# Patient Record
Sex: Male | Born: 1995 | Race: Black or African American | Hispanic: No | Marital: Single | Smoking: Never smoker
Health system: Southern US, Community
[De-identification: ages and names within clinical notes are randomized; demographics above are authoritative.]

---

## 2020-01-12 ENCOUNTER — Other Ambulatory Visit: Payer: Self-pay

## 2020-08-13 ENCOUNTER — Emergency Department (HOSPITAL_COMMUNITY): Payer: Medicaid Other

## 2020-08-13 ENCOUNTER — Emergency Department (HOSPITAL_COMMUNITY)
Admission: EM | Admit: 2020-08-13 | Discharge: 2020-08-13 | Disposition: A | Discharge status: Home / Self Care | Payer: Medicaid Other | Attending: Emergency Medicine | Admitting: Emergency Medicine

## 2020-08-13 ENCOUNTER — Other Ambulatory Visit: Payer: Self-pay

## 2020-08-13 ENCOUNTER — Encounter (HOSPITAL_COMMUNITY): Payer: Self-pay | Admitting: Emergency Medicine

## 2020-08-13 DIAGNOSIS — Z20822 Contact with and (suspected) exposure to covid-19: Secondary | ICD-10-CM | POA: Insufficient documentation

## 2020-08-13 DIAGNOSIS — R079 Chest pain, unspecified: Secondary | ICD-10-CM | POA: Insufficient documentation

## 2020-08-13 DIAGNOSIS — J069 Acute upper respiratory infection, unspecified: Secondary | ICD-10-CM

## 2020-08-13 LAB — CBC WITH DIFFERENTIAL/PLATELET
Abs Immature Granulocytes: 0.03 10*3/uL (ref 0.00–0.07)
Basophils Absolute: 0.1 10*3/uL (ref 0.0–0.1)
Basophils Relative: 1 %
Eosinophils Absolute: 0.2 10*3/uL (ref 0.0–0.5)
Eosinophils Relative: 3 %
HCT: 46.5 % (ref 39.0–52.0)
Hemoglobin: 15.8 g/dL (ref 13.0–17.0)
Immature Granulocytes: 0 %
Lymphocytes Relative: 46 %
Lymphs Abs: 4.1 10*3/uL — ABNORMAL HIGH (ref 0.7–4.0)
MCH: 26.2 pg (ref 26.0–34.0)
MCHC: 34 g/dL (ref 30.0–36.0)
MCV: 77 fL — ABNORMAL LOW (ref 80.0–100.0)
Monocytes Absolute: 0.7 10*3/uL (ref 0.1–1.0)
Monocytes Relative: 8 %
Neutro Abs: 3.7 10*3/uL (ref 1.7–7.7)
Neutrophils Relative %: 42 %
Platelets: 378 10*3/uL (ref 150–400)
RBC: 6.04 MIL/uL — ABNORMAL HIGH (ref 4.22–5.81)
RDW: 15 % (ref 11.5–15.5)
WBC: 8.8 10*3/uL (ref 4.0–10.5)
nRBC: 0 % (ref 0.0–0.2)

## 2020-08-13 LAB — RESPIRATORY PANEL BY RT PCR (FLU A&B, COVID)
Influenza A by PCR: NEGATIVE
Influenza B by PCR: NEGATIVE
SARS Coronavirus 2 by RT PCR: NEGATIVE

## 2020-08-13 LAB — BASIC METABOLIC PANEL
Anion gap: 10 (ref 5–15)
BUN: 10 mg/dL (ref 6–20)
CO2: 26 mmol/L (ref 22–32)
Calcium: 9.4 mg/dL (ref 8.9–10.3)
Chloride: 102 mmol/L (ref 98–111)
Creatinine, Ser: 0.84 mg/dL (ref 0.61–1.24)
GFR, Estimated: >60 mL/min (ref >60)
Glucose, Bld: 98 mg/dL (ref 70–99)
Potassium: 4 mmol/L (ref 3.5–5.1)
Sodium: 138 mmol/L (ref 135–145)

## 2020-08-13 LAB — TROPONIN I (HIGH SENSITIVITY): Troponin I (High Sensitivity): <2 ng/L (ref <18)

## 2020-08-13 MED ORDER — ALBUTEROL SULFATE HFA 108 (90 BASE) MCG/ACT IN AERS
1.0000 | INHALATION_SPRAY | Freq: Four times a day (QID) | RESPIRATORY_TRACT | 0 refills | Status: AC | PRN
Start: 2020-08-13 — End: ?

## 2020-08-13 MED ORDER — IPRATROPIUM-ALBUTEROL 0.5-2.5 (3) MG/3ML IN SOLN
3.0000 mL | Freq: Four times a day (QID) | RESPIRATORY_TRACT | Status: DC
  Administered 2020-08-13: 3 mL via RESPIRATORY_TRACT
  Filled 2020-08-13: qty 3

## 2020-08-13 NOTE — ED Notes (Signed)
Pt requesting to speak with charge nurse. RN present.

## 2020-08-13 NOTE — Discharge Instructions (Addendum)
Seen with shortness of breath and chest pain.  Lab work and imaging looks reassuring.  I have given you an inhaler please use every 6 as needed for wheezing and shortness of breath.  Also recommend taking Claritin daily as this can help with your exacerbations.  I recommend that you follow-up with a PCP for further evaluation.  I given you the contact information for one please contact them to schedule an appointment.  Come back to the emergency department if you develop chest pain, shortness of breath, severe abdominal pain, uncontrolled nausea, vomiting, diarrhea.

## 2020-08-13 NOTE — ED Notes (Signed)
PT question lab work state he does not wont any shots. Info PT writer does not give shot only in room to collect blood work. PT allow writer to collect labs

## 2020-08-13 NOTE — ED Triage Notes (Signed)
Patient reports living in an apartment with mold around mold in May, has had a cough and SOB since then.

## 2020-08-13 NOTE — ED Provider Notes (Signed)
[facility]-EMERGENCY DEPT Provider Note   CSN: [id number] Arrival date & time: 08/13/20  1352     History Chief Complaint  Patient presents with  . Cough  . mold exposure    Mason Mclean. is a 24 y.o. male.  HPI   Patient with no significant medical history presents to the emergency department with chief complaint of shortness of breath and chest pain.  Patient endorses this has been going on since May of this year, believes this may be possibly due to mold from his old apartment but states he moved out in July and continues to have exacerbations of URI-like symptoms.  He endorses these exacerbations last for a week at a time and he experiences  nasal congestion, postnasal drip, a dry cough, chest pain, and subjective fevers and chills.  He states he notices his URI-like symptoms started after being in social events friends and social events.   he has been tested multiple times for COVID-19 has and has been negative each time.  He denies history of allergies, having lung issues, like COPD or emphysema, denies smoking history, denies history of DVTs or PEs, is not on hormone therapy, recent surgeries or leg swelling.  He has no cardiac history.  He states he is not been seen by his primary care office for his symptom.  Patient denies any alleviating factors at this time.  Patient denies nausea, vomiting, diarrhea, constipation, dysuria, pedal edema.  History reviewed. No pertinent past medical history.  There are no problems to display for this patient.   History reviewed. No pertinent surgical history.     No family history on file.  Social History   Tobacco Use  . Smoking status: Not on file  Substance Use Topics  . Alcohol use: Not on file  . Drug use: Not on file    Home Medications Prior to Admission medications   Medication Sig Start Date End Date Taking? Authorizing Provider  albuterol (VENTOLIN HFA) 108 (90 Base) MCG/ACT inhaler Inhale 1-2  puffs into the lungs every 6 (six) hours as needed for wheezing or shortness of breath. 08/13/20   Carroll Sage, PA-C    Allergies    Patient has no known allergies.  Review of Systems   Review of Systems  Constitutional: Positive for chills and fever.  HENT: Positive for congestion and postnasal drip.   Eyes: Negative for visual disturbance.  Respiratory: Positive for cough and shortness of breath.   Cardiovascular: Positive for chest pain.  Gastrointestinal: Negative for abdominal pain, diarrhea, nausea and vomiting.  Genitourinary: Negative for enuresis and flank pain.  Musculoskeletal: Negative for back pain.  Skin: Negative for rash.  Neurological: Negative for dizziness and headaches.  Hematological: Does not bruise/bleed easily.    Physical Exam Updated Vital Signs BP 138/80 (BP Location: Right Arm)   Pulse 67   Temp 97.9 F (36.6 C) (Oral)   Resp 17   Ht 6\' 3"  (1.905 m)   Wt 95.3 kg   SpO2 99%   BMI 26.25 kg/m   Physical Exam Vitals and nursing note reviewed.  Constitutional:      General: He is not in acute distress.    Appearance: He is not ill-appearing.  HENT:     Head: Normocephalic and atraumatic.     Right Ear: Tympanic membrane, ear canal and external ear normal.     Left Ear: Tympanic membrane, ear canal and external ear normal.     Nose: No congestion  or rhinorrhea.     Mouth/Throat:     Pharynx: No oropharyngeal exudate or posterior oropharyngeal erythema.  Eyes:     Conjunctiva/sclera: Conjunctivae normal.  Cardiovascular:     Rate and Rhythm: Normal rate and regular rhythm.     Pulses: Normal pulses.     Heart sounds: No murmur heard.  No friction rub. No gallop.   Pulmonary:     Effort: No respiratory distress.     Breath sounds: No wheezing, rhonchi or rales.  Abdominal:     Palpations: Abdomen is soft.     Tenderness: There is no abdominal tenderness.  Musculoskeletal:     Right lower leg: No edema.     Left lower leg: No  edema.  Skin:    General: Skin is warm and dry.  Neurological:     Mental Status: He is alert.  Psychiatric:        Mood and Affect: Mood normal.     ED Results / Procedures / Treatments   Labs (all labs ordered are listed, but only abnormal results are displayed) Labs Reviewed  CBC WITH DIFFERENTIAL/PLATELET - Abnormal; Notable for the following components:      Result Value   RBC 6.04 (*)    MCV 77.0 (*)    Lymphs Abs 4.1 (*)    All other components within normal limits  RESPIRATORY PANEL BY RT PCR (FLU A&B, COVID)  BASIC METABOLIC PANEL  TROPONIN I (HIGH SENSITIVITY)    EKG None  Radiology DG Chest 2 View  Result Date: 08/13/2020 CLINICAL DATA:  Cough and shortness of breath EXAM: CHEST - 2 VIEW COMPARISON:  None. FINDINGS: Lungs are clear. Heart size and pulmonary vascularity are normal. No adenopathy. No bone lesions. IMPRESSION: Lungs clear.  Cardiac silhouette normal. Electronically Signed   By: Bretta Bang III M.D.   On: 08/13/2020 14:32    Procedures Procedures (including critical care time)  Medications Ordered in ED Medications  ipratropium-albuterol (DUONEB) 0.5-2.5 (3) MG/3ML nebulizer solution 3 mL (3 mLs Inhalation Given 08/13/20 2035)    ED Course  I have reviewed the triage vital signs and the nursing notes.  Pertinent labs & imaging results that were available during my care of the patient were reviewed by me and considered in my medical decision making (see chart for details).    MDM Rules/Calculators/A&P                          Patient presents with URI-like symptoms, chest pain shortness of breath.  He is alert, does not appear in acute distress, vital signs reassuring.  Will order basic lab work, EKG chest x-ray and reevaluate.  Patient was reassessed after providing him with Combivent and states he is feeling much better, states his chest is movement better and is not having any chest pain.   CBC negative for leukocytosis or signs  of anemia.  BMP negative for electrolyte abnormalities, no metabolic acidosis, no AKI, no anion gap noted.  Respiratory panel negative for Covid, influenza A/B.  Initial troponin is less than 2. Chest x-ray does not reveal any acute findings. EKG sinus bradycardia without signs of ischemia no ST elevation depression, no arrhythmias noted.  I have low suspicion for ACS or arrhythmia as history is atypical, patient has no cardiac history, EKG was sinus bradycardia without signs of ischemia.  Will defer second troponin as patient has had chest pain for the last 3 months.  Low suspicion  for PE as patient was PERC negative.  Low suspicion for AAA or aortic dissection as history is atypical, patient has low risk factors.  Low suspicion for systemic infection as patient is nontoxic-appearing, vital signs reassuring, no obvious source infection noted on exam.  I suspect patient may be allergies or possible asthma as he improved with Combivent.  Will recommend he continues with albuterol inhaler, H1 inhibitors and follow-up with PCP for further evaluation management.   Vital signs have remained stable, no indication for hospital admission.  Patient given at home care as well strict return precautions.  Patient verbalized that they understood agreed to said plan.   Final Clinical Impression(s) / ED Diagnoses Final diagnoses:  Viral URI with cough    Rx / DC Orders ED Discharge Orders         Ordered    albuterol (VENTOLIN HFA) 108 (90 Base) MCG/ACT inhaler  Every 6 hours PRN        08/13/20 2152           Carroll Sage, PA-C 08/13/20 2203    Vanetta Mulders, MD 09/07/20 [phone]

## 2020-08-13 NOTE — ED Notes (Signed)
Patient came to nurses station he is going home and he wants a work note. Patient encouraged to stay for labs to complete.

## 2021-06-25 IMAGING — CR DG CHEST 2V
2 series · 2 of 2 positions shown · non-contrast
Comparison: None.

CLINICAL DATA: Cough and shortness of breath

EXAM:
CHEST - 2 VIEW

[w chest pa]
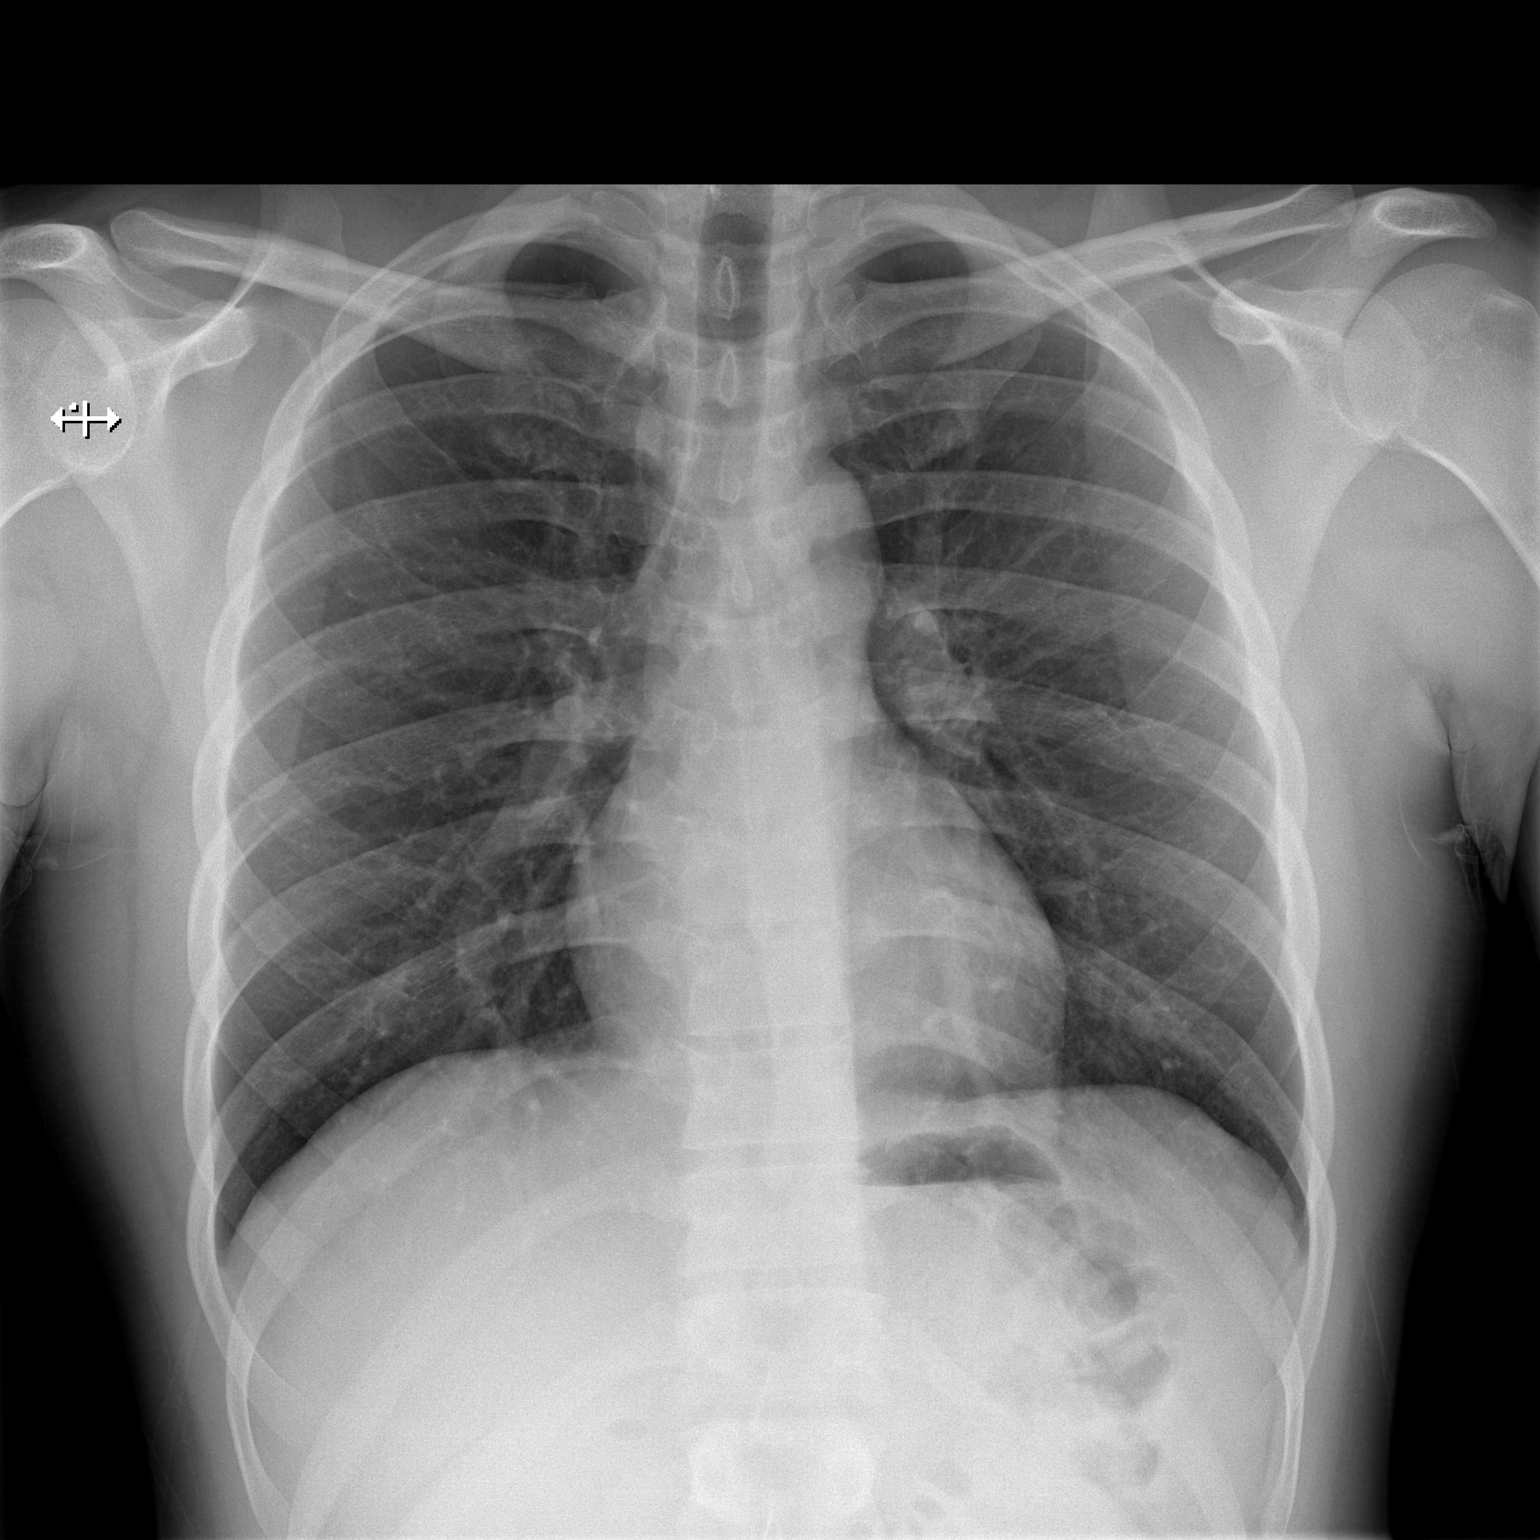

[w chest lat]
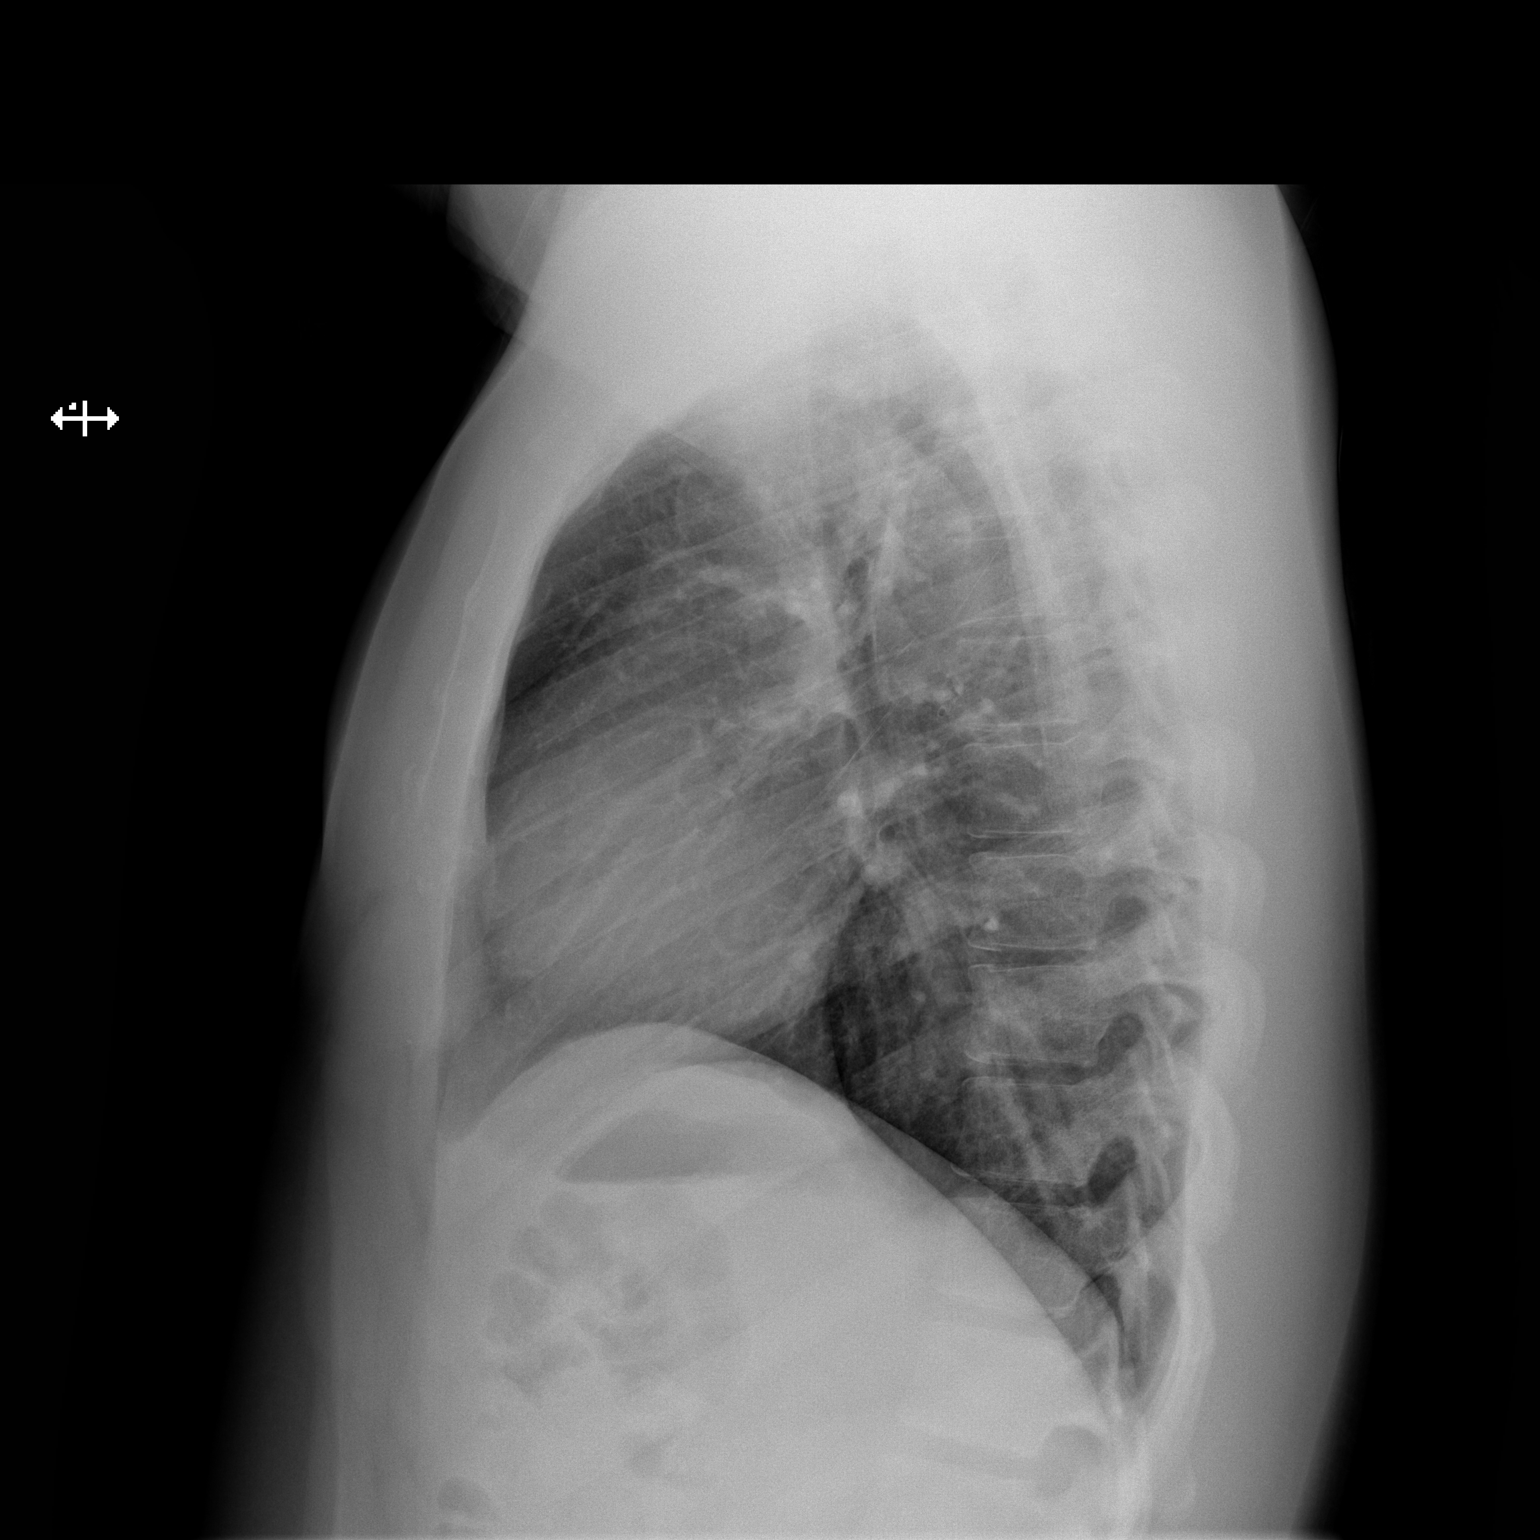

[2 of 2 positions shown; findings below may reference images not displayed]

FINDINGS: Lungs are clear. Heart size and pulmonary vascularity are normal. No
adenopathy. No bone lesions.
IMPRESSION: Lungs clear.  Cardiac silhouette normal.

## 2021-11-17 ENCOUNTER — Ambulatory Visit
Admission: EM | Admit: 2021-11-17 | Discharge: 2021-11-17 | Discharge status: Against Medical Advice | Payer: BC Managed Care – PPO

## 2021-11-17 ENCOUNTER — Other Ambulatory Visit: Payer: Self-pay

## 2021-11-17 NOTE — ED Notes (Signed)
Patient not in the lobby after 20 minutes. Called him, he stated his check didn't post today and that he would be back in the morning. Offered him an appointment, he hung up the phone.

## 2022-10-13 ENCOUNTER — Emergency Department (HOSPITAL_COMMUNITY)
Admission: EM | Admit: 2022-10-13 | Discharge: 2022-10-14 | Disposition: A | Discharge status: Home / Self Care | Payer: Medicaid Other | Attending: Emergency Medicine | Admitting: Emergency Medicine

## 2022-10-13 ENCOUNTER — Encounter (HOSPITAL_COMMUNITY): Payer: Self-pay | Admitting: Emergency Medicine

## 2022-10-13 ENCOUNTER — Other Ambulatory Visit: Payer: Self-pay

## 2022-10-13 DIAGNOSIS — H5711 Ocular pain, right eye: Secondary | ICD-10-CM | POA: Diagnosis present

## 2022-10-13 DIAGNOSIS — H1089 Other conjunctivitis: Secondary | ICD-10-CM | POA: Diagnosis not present

## 2022-10-13 DIAGNOSIS — H109 Unspecified conjunctivitis: Secondary | ICD-10-CM

## 2022-10-13 DIAGNOSIS — B9689 Other specified bacterial agents as the cause of diseases classified elsewhere: Secondary | ICD-10-CM | POA: Diagnosis not present

## 2022-10-13 DIAGNOSIS — H1031 Unspecified acute conjunctivitis, right eye: Secondary | ICD-10-CM | POA: Diagnosis not present

## 2022-10-13 MED ORDER — TETRACAINE HCL 0.5 % OP SOLN
1.0000 [drp] | Freq: Once | OPHTHALMIC | Status: DC
  Filled 2022-10-13: qty 4

## 2022-10-13 MED ORDER — HYDROCODONE-ACETAMINOPHEN 5-325 MG PO TABS
2.0000 | ORAL_TABLET | Freq: Once | ORAL | Status: AC
  Administered 2022-10-14: 2 via ORAL
  Filled 2022-10-13: qty 2

## 2022-10-13 MED ORDER — CIPROFLOXACIN HCL 0.3 % OP SOLN
1.0000 [drp] | OPHTHALMIC | Status: DC
  Administered 2022-10-14: 1 [drp] via OPHTHALMIC
  Filled 2022-10-13: qty 2.5

## 2022-10-13 MED ORDER — FLUORESCEIN SODIUM 1 MG OP STRP
1.0000 | ORAL_STRIP | Freq: Once | OPHTHALMIC | Status: DC
  Filled 2022-10-13: qty 1

## 2022-10-13 NOTE — ED Provider Notes (Signed)
[facility] DEPT Provider Note   CSN: [id number] Arrival date & time: 10/13/22  2318     History  Chief Complaint  Patient presents with   Eye Drainage    Mason Mclean. is a 27 y.o. male.  Patient complains of right eye redness and pain after wearing new contacts 1 week ago.  Patient reports redness has progressively gotten worse this morning he had yellow crusting and drainage.  Patient reports he has had similar in the past and was diagnosed with a corneal abrasion  The history is provided by the patient. No language interpreter was used.  Conjunctivitis This is a new problem. The problem occurs constantly. Nothing aggravates the symptoms.       Home Medications Prior to Admission medications   Medication Sig Start Date End Date Taking? Authorizing Provider  albuterol (VENTOLIN HFA) 108 (90 Base) MCG/ACT inhaler Inhale 1-2 puffs into the lungs every 6 (six) hours as needed for wheezing or shortness of breath. 08/13/20   Marcello Fennel, PA-C      Allergies    Patient has no known allergies.    Review of Systems   Review of Systems  All other systems reviewed and are negative.   Physical Exam Updated Vital Signs BP (!) 142/91 (BP Location: Right Arm)   Pulse 62   Temp 98.9 F (37.2 C) (Oral)   Resp 16   Wt 86.2 kg   SpO2 99%   BMI 23.75 kg/m  Physical Exam Vitals and nursing note reviewed.  Constitutional:      Appearance: He is well-developed.  HENT:     Head: Normocephalic and atraumatic.  Eyes:     General:        Right eye: Discharge present.     Extraocular Movements: Extraocular movements intact.     Pupils: Pupils are equal, round, and reactive to light.     Comments: Injected right conjunctiva, tearing, fluorescein no ulcer, pinpoint area of uptake  Pulmonary:     Effort: Pulmonary effort is normal.  Abdominal:     General: There is no distension.  Musculoskeletal:        General: Normal range of  motion.     Cervical back: Normal range of motion.  Neurological:     Mental Status: He is alert and oriented to person, place, and time.  Psychiatric:        Mood and Affect: Mood normal.     ED Results / Procedures / Treatments   Labs (all labs ordered are listed, but only abnormal results are displayed) Labs Reviewed - No data to display  EKG None  Radiology No results found.  Procedures Procedures    Medications Ordered in ED Medications  tetracaine (PONTOCAINE) 0.5 % ophthalmic solution 1 drop (has no administration in time range)  fluorescein ophthalmic strip 1 strip (has no administration in time range)  ciprofloxacin (CILOXAN) 0.3 % ophthalmic solution 1 drop (has no administration in time range)  HYDROcodone-acetaminophen (NORCO/VICODIN) 5-325 MG per tablet 2 tablet (has no administration in time range)    ED Course/ Medical Decision Making/ A&P                             Medical Decision Making Patient complains of right eye redness and drainage  Amount and/or Complexity of Data Reviewed Independent Historian: friend  Risk Prescription drug management. Risk Details: Patient given ciprofloxacin ophthalmic solution he is advised  1 drop every 2 hours patient is given 2 hydrocodone here for pain patient is advised to follow-up with his ophthalmologist next week he is to return if any change in vision or worsening symptoms           Final Clinical Impression(s) / ED Diagnoses Final diagnoses:  Bacterial conjunctivitis of right eye    Rx / DC Orders ED Discharge Orders     None      An After Visit Summary was printed and given to the patient.    Fransico Meadow, PA-C 10/14/22 0004    Fatima Blank, MD 10/14/22 [phone]

## 2022-10-13 NOTE — ED Provider Triage Note (Signed)
Emergency Medicine Provider Triage Evaluation Note  Mason Mclean. , a 27 y.o. male  was evaluated in triage.  Pt complains of right eye redness and pain for 1 week.  Pain began after wearing contacts.   Review of Systems  Positive: drainage Negative: fever  Physical Exam  BP (!) 142/91 (BP Location: Right Arm)   Pulse 62   Temp 98.9 F (37.2 C) (Oral)   Resp 16   Wt 86.2 kg   SpO2 99%   BMI 23.75 kg/m  Gen:   Awake, no distress   Resp:  Normal effort  MSK:   Moves extremities without difficulty  Other:  Right eye injected, photophobic   Medical Decision Making  Medically screening exam initiated at 11:41 PM.  Appropriate orders placed.  [employer] was informed that the remainder of the evaluation will be completed by another provider, this initial triage assessment does not replace that evaluation, and the importance of remaining in the ED until their evaluation is complete.     Fransico Meadow, [place] 10/13/22 2342

## 2022-10-13 NOTE — Discharge Instructions (Signed)
Return if symptoms worsen or change in vision.  See your eye doctor for recheck next week.

## 2022-10-13 NOTE — ED Triage Notes (Signed)
Pt presents for R eye redness, photophobia, pain, AM drainage since Friday. Wears contacts but has not been wearing these in this eye since started.  Denies fever

## 2022-10-14 NOTE — ED Notes (Signed)
Patient denies pain and is resting comfortably.  

## 2022-12-14 ENCOUNTER — Encounter (HOSPITAL_COMMUNITY): Payer: Self-pay

## 2022-12-14 ENCOUNTER — Emergency Department (HOSPITAL_COMMUNITY)
Admission: EM | Admit: 2022-12-14 | Discharge: 2022-12-15 | Disposition: A | Discharge status: Home / Self Care | Payer: Medicaid Other | Attending: Emergency Medicine | Admitting: Emergency Medicine

## 2022-12-14 DIAGNOSIS — X58XXXA Exposure to other specified factors, initial encounter: Secondary | ICD-10-CM | POA: Diagnosis not present

## 2022-12-14 DIAGNOSIS — S01511A Laceration without foreign body of lip, initial encounter: Secondary | ICD-10-CM | POA: Insufficient documentation

## 2022-12-14 DIAGNOSIS — S0993XA Unspecified injury of face, initial encounter: Secondary | ICD-10-CM | POA: Diagnosis present

## 2022-12-14 DIAGNOSIS — Y9361 Activity, american tackle football: Secondary | ICD-10-CM | POA: Insufficient documentation

## 2022-12-14 MED ORDER — LIDOCAINE HCL 2 % IJ SOLN
5.0000 mL | Freq: Once | INTRAMUSCULAR | Status: AC
  Administered 2022-12-14: 100 mg
  Filled 2022-12-14: qty 20

## 2022-12-14 MED ORDER — TETANUS-DIPHTH-ACELL PERTUSSIS 5-2.5-18.5 LF-MCG/0.5 IM SUSY
0.5000 mL | PREFILLED_SYRINGE | Freq: Once | INTRAMUSCULAR | Status: DC
  Filled 2022-12-14: qty 0.5

## 2022-12-14 NOTE — Discharge Instructions (Addendum)
You were seen in the emergency department for lip laceration.   We have closed your laceration(s) with sutures. These should dissolve within 7-10 days.   If any of the sutures come out before it is time for removal, that is okay. Make sure to keep the area as clean as possible. Swish with plain water or saline water 1-2 times daily.   Watch out for signs of infection, like we discussed, including: increased redness, tenderness, or drainage of pus from the area. If this happens and you have not been prescribed an antibiotic, please seek medical attention for possible infection.   You can take over the counter pain medicine like ibuprofen or tylenol as needed.

## 2022-12-14 NOTE — ED Triage Notes (Signed)
Pt was playing football and bit his lip, small laceration to inside of lip. No bleeding at this time, last tetanus unknown.

## 2022-12-14 NOTE — ED Notes (Signed)
Returned to bedside to administer tdap and discuss discharge instructions. Pt was no longer in room.

## 2022-12-15 NOTE — ED Provider Notes (Signed)
[facility] Provider Note   CSN: [id number] Arrival date & time: 12/14/22  2214     History  Chief Complaint  Patient presents with   Lip Laceration    Mason Mclean. is a 27 y.o. male no significant past medical history presents the emergency department complaining of lip laceration while playing football earlier tonight.  He said he excellently bit the inside of his lip.  Denies any other injuries, no loss of consciousness.  Last tetanus unknown.  HPI     Home Medications Prior to Admission medications   Medication Sig Start Date End Date Taking? Authorizing Provider  albuterol (VENTOLIN HFA) 108 (90 Base) MCG/ACT inhaler Inhale 1-2 puffs into the lungs every 6 (six) hours as needed for wheezing or shortness of breath. 08/13/20   Marcello Fennel, PA-C      Allergies    Patient has no known allergies.    Review of Systems   Review of Systems  Skin:  Positive for wound.  All other systems reviewed and are negative.   Physical Exam Updated Vital Signs BP 136/88 (BP Location: Right Arm)   Pulse 68   Temp 98.5 F (36.9 C) (Oral)   Resp 16   SpO2 98%  Physical Exam Vitals and nursing note reviewed.  Constitutional:      Appearance: Normal appearance.  HENT:     Head: Normocephalic and atraumatic.     Mouth/Throat:     Lips: Pink.     Mouth: Mucous membranes are moist.     Comments: Approximately 1.5 cm lesion to the inner upper right lip, gaping.  No dental injury. Eyes:     Conjunctiva/sclera: Conjunctivae normal.  Pulmonary:     Effort: Pulmonary effort is normal. No respiratory distress.  Skin:    General: Skin is warm and dry.  Neurological:     Mental Status: He is alert.  Psychiatric:        Mood and Affect: Mood normal.        Behavior: Behavior normal.     ED Results / Procedures / Treatments   Labs (all labs ordered are listed, but only abnormal results are displayed) Labs Reviewed - No  data to display  EKG None  Radiology No results found.  Procedures .Marland KitchenLaceration Repair  Date/Time: 12/15/2022 12:36 AM  Performed by: Kateri Plummer, PA-C Authorized by: Kateri Plummer, PA-C   Consent:    Consent obtained:  Verbal   Consent given by:  Patient   Risks discussed:  Infection, pain, poor cosmetic result and poor wound healing Universal protocol:    Procedure explained and questions answered to patient or proxy's satisfaction: yes     Patient identity confirmed:  Provided demographic data Anesthesia:    Anesthesia method:  Local infiltration   Local anesthetic:  Lidocaine 2% w/o epi Laceration details:    Location:  Lip   Lip location:  Upper interior lip   Length (cm):  1.5   Depth (mm):  2 Pre-procedure details:    Preparation:  Patient was prepped and draped in usual sterile fashion Exploration:    Hemostasis achieved with:  Direct pressure Treatment:    Area cleansed with:  Saline   Amount of cleaning:  Standard Skin repair:    Repair method:  Sutures   Suture size:  5-0   Suture material:  Fast-absorbing gut   Number of sutures:  2 Approximation:    Approximation:  Close  Vermilion border well-aligned: yes   Repair type:    Repair type:  Simple Post-procedure details:    Dressing:  Open (no dressing)   Procedure completion:  Tolerated well, no immediate complications     Medications Ordered in ED Medications  Tdap (BOOSTRIX) injection 0.5 mL (has no administration in time range)  lidocaine (XYLOCAINE) 2 % (with pres) injection 100 mg (100 mg Infiltration Given by Other 12/14/22 2304)    ED Course/ Medical Decision Making/ A&P                             Medical Decision Making Risk Prescription drug management.   Patient is a 27 y.o. male who presents to the emergency department with concern for inner lip laceration. Wound occurred <8 hrs prior to ER arrival.   Physical exam: Approximately 1.5 cm laceration noted to the  upper anterior lip.  No dental injury  Procedure: Wound explored and base of wound visualized in a bloodless field without evidence of foreign body. Laceration was anesthestized with lidocaine and closed with sutures. Patient tolerated procedure well with no immediate complications. Tdap was ordered to be administered, but patient had eloped prior to receiving discharge instructions.   Disposition: Patient has  no comorbidities to effect normal wound healing. Patient discharged  without antibiotics.  Discussed suture home care with patient and answered questions. Patient to follow-up for wound check and sutures will dissolve; they are to return to the ED sooner for signs of infection. Pt is hemodynamically stable with no complaints prior to discharge.  Final Clinical Impression(s) / ED Diagnoses Final diagnoses:  Lip laceration, initial encounter    Rx / DC Orders ED Discharge Orders     None      Portions of this report may have been transcribed using voice recognition software. Every effort was made to ensure accuracy; however, inadvertent computerized transcription errors may be present.    Estill Cotta 12/15/22 0038    Tretha Sciara, MD 12/15/22 1505

## 2023-01-18 ENCOUNTER — Other Ambulatory Visit: Payer: Self-pay

## 2023-01-18 ENCOUNTER — Emergency Department (HOSPITAL_COMMUNITY)
Admission: EM | Admit: 2023-01-18 | Discharge: 2023-01-18 | Disposition: A | Discharge status: Home / Self Care | Payer: BC Managed Care – PPO | Attending: Emergency Medicine | Admitting: Emergency Medicine

## 2023-01-18 ENCOUNTER — Encounter (HOSPITAL_COMMUNITY): Payer: Self-pay

## 2023-01-18 DIAGNOSIS — R220 Localized swelling, mass and lump, head: Secondary | ICD-10-CM | POA: Diagnosis present

## 2023-01-18 DIAGNOSIS — L723 Sebaceous cyst: Secondary | ICD-10-CM | POA: Diagnosis not present

## 2023-01-18 MED ORDER — LIDOCAINE HCL (PF) 1 % IJ SOLN
5.0000 mL | Freq: Once | INTRAMUSCULAR | Status: AC
  Administered 2023-01-18: 5 mL
  Filled 2023-01-18: qty 30

## 2023-01-18 MED ORDER — DOXYCYCLINE HYCLATE 100 MG PO CAPS: 100.0000 mg | ORAL_CAPSULE | Freq: Two times a day (BID) | ORAL | 0 refills | Status: AC

## 2023-01-18 NOTE — ED Triage Notes (Signed)
Pt states he thinks he was bit on his forehead by something on Saturday. Pt has bump to forehead that has been worsening. Pt denies draining of fluid

## 2023-01-18 NOTE — ED Provider Notes (Signed)
[facility] EMERGENCY DEPARTMENT AT [facility] Provider Note   CSN: [id number] Arrival date & time: 01/18/23  1619     History  Chief Complaint  Patient presents with   Insect Bite    Mason Mclean. is a 27 y.o. male.  Pt complains of swollen area on his forehead. Pt thinks he was bitten by a bug.  Pt reports increased swelling   The history is provided by the patient. No language interpreter was used.       Home Medications Prior to Admission medications   Medication Sig Start Date End Date Taking? Authorizing Provider  doxycycline (VIBRAMYCIN) 100 MG capsule Take 1 capsule (100 mg total) by mouth 2 (two) times daily. 01/18/23  Yes Cheron Schaumann K, PA-C  albuterol (VENTOLIN HFA) 108 (90 Base) MCG/ACT inhaler Inhale 1-2 puffs into the lungs every 6 (six) hours as needed for wheezing or shortness of breath. 08/13/20   Carroll Sage, PA-C      Allergies    Patient has no known allergies.    Review of Systems   Review of Systems  All other systems reviewed and are negative.   Physical Exam Updated Vital Signs BP 126/74   Pulse 79   Temp 98.7 F (37.1 C) (Oral)   Resp 16   Ht  (1.88 m)   Wt 86.2 kg   SpO2 97%   BMI 24.39 kg/m  Physical Exam Vitals and nursing note reviewed.  Constitutional:      Appearance: He is well-developed.  HENT:     Head: Normocephalic.     Comments: 1cm area of swelling forehead pointing Cardiovascular:     Rate and Rhythm: Normal rate.  Pulmonary:     Effort: Pulmonary effort is normal.  Abdominal:     General: There is no distension.  Musculoskeletal:        General: Normal range of motion.  Skin:    General: Skin is warm.  Neurological:     General: No focal deficit present.     Mental Status: He is alert and oriented to person, place, and time.  Psychiatric:        Mood and Affect: Mood normal.     ED Results / Procedures / Treatments   Labs (all labs ordered are listed, but only abnormal  results are displayed) Labs Reviewed - No data to display  EKG None  Radiology No results found.  Procedures .Marland KitchenIncision and Drainage  Date/Time: 01/18/2023 6:04 PM  Performed by: Elson Areas, PA-C Authorized by: Elson Areas, PA-C   Consent:    Consent obtained:  Verbal   Consent given by:  Patient   Risks discussed:  Incomplete drainage Universal protocol:    Procedure explained and questions answered to patient or proxy's satisfaction: no     Patient identity confirmed:  Verbally with patient Location:    Type:  Abscess   Size:  1   Location:  Head   Head location:  Face Pre-procedure details:    Skin preparation:  Povidone-iodine Anesthesia:    Anesthesia method:  Local infiltration   Local anesthetic:  Bupivacaine 0.25% w/o epi Procedure type:    Complexity:  Simple Procedure details:    Wound management:  Probed and deloculated   Wound treatment:  Wound left open   Packing materials:  None Post-procedure details:    Procedure completion:  Tolerated     Medications Ordered in ED Medications  lidocaine (PF) (XYLOCAINE) 1 %  injection 5 mL (5 mLs Infiltration Given by Other 01/18/23 1658)    ED Course/ Medical Decision Making/ A&P                             Medical Decision Making Risk Prescription drug management.           Final Clinical Impression(s) / ED Diagnoses Final diagnoses:  Sebaceous cyst    Rx / DC Orders ED Discharge Orders          Ordered    doxycycline (VIBRAMYCIN) 100 MG capsule  2 times daily        01/18/23 1708           An After Visit Summary was printed and given to the patient.    Elson Areas, Cordelia Poche 01/18/23 1805    Glyn Ade, MD 01/19/23 1456

## 2023-01-18 NOTE — Discharge Instructions (Addendum)
Return if any problems.

## 2023-06-05 DIAGNOSIS — H5712 Ocular pain, left eye: Secondary | ICD-10-CM | POA: Diagnosis not present

## 2023-06-05 DIAGNOSIS — H538 Other visual disturbances: Secondary | ICD-10-CM | POA: Diagnosis not present

## 2023-06-05 DIAGNOSIS — H1032 Unspecified acute conjunctivitis, left eye: Secondary | ICD-10-CM | POA: Diagnosis not present
